# Patient Record
Sex: Female | Born: 1985 | Race: Asian | Hispanic: No | Marital: Married | Smoking: Former smoker
Health system: Southern US, Community
[De-identification: ages and names within clinical notes are randomized; demographics above are authoritative.]

## PROBLEM LIST (undated history)

## (undated) HISTORY — PX: APPENDECTOMY: SHX54

---

## 2015-09-12 ENCOUNTER — Emergency Department
Admission: EM | Admit: 2015-09-12 | Discharge: 2015-09-12 | Disposition: A | Discharge status: Home / Self Care | Payer: BLUE CROSS/BLUE SHIELD | Attending: Emergency Medicine | Admitting: Emergency Medicine

## 2015-09-12 ENCOUNTER — Encounter: Payer: Self-pay | Admitting: Emergency Medicine

## 2015-09-12 ENCOUNTER — Emergency Department: Payer: BLUE CROSS/BLUE SHIELD

## 2015-09-12 DIAGNOSIS — O209 Hemorrhage in early pregnancy, unspecified: Secondary | ICD-10-CM | POA: Insufficient documentation

## 2015-09-12 DIAGNOSIS — Z87891 Personal history of nicotine dependence: Secondary | ICD-10-CM | POA: Diagnosis not present

## 2015-09-12 DIAGNOSIS — Z3A01 Less than 8 weeks gestation of pregnancy: Secondary | ICD-10-CM | POA: Insufficient documentation

## 2015-09-12 LAB — COMPREHENSIVE METABOLIC PANEL
ALT: 20 U/L (ref 14–54)
ANION GAP: 5 (ref 5–15)
AST: 24 U/L (ref 15–41)
Albumin: 4.3 g/dL (ref 3.5–5.0)
Alkaline Phosphatase: 48 U/L (ref 38–126)
BUN: 8 mg/dL (ref 6–20)
CHLORIDE: 109 mmol/L (ref 101–111)
CO2: 24 mmol/L (ref 22–32)
CREATININE: 0.57 mg/dL (ref 0.44–1.00)
Calcium: 9.3 mg/dL (ref 8.9–10.3)
Glucose, Bld: 122 mg/dL — ABNORMAL HIGH (ref 65–99)
Potassium: 3.8 mmol/L (ref 3.5–5.1)
SODIUM: 138 mmol/L (ref 135–145)
Total Bilirubin: 0.6 mg/dL (ref 0.3–1.2)
Total Protein: 7.1 g/dL (ref 6.5–8.1)

## 2015-09-12 LAB — URINALYSIS COMPLETE WITH MICROSCOPIC (ARMC ONLY)
BILIRUBIN URINE: NEGATIVE
Glucose, UA: NEGATIVE mg/dL
KETONES UR: NEGATIVE mg/dL
Leukocytes, UA: NEGATIVE
NITRITE: NEGATIVE
PH: 7 (ref 5.0–8.0)
Protein, ur: NEGATIVE mg/dL
SPECIFIC GRAVITY, URINE: 1.004 — AB (ref 1.005–1.030)

## 2015-09-12 LAB — CBC WITH DIFFERENTIAL/PLATELET
Basophils Absolute: 0.1 10*3/uL (ref 0–0.1)
Basophils Relative: 1 %
EOS ABS: 0 10*3/uL (ref 0–0.7)
EOS PCT: 1 %
HCT: 36.8 % (ref 35.0–47.0)
Hemoglobin: 12.2 g/dL (ref 12.0–16.0)
LYMPHS ABS: 1.3 10*3/uL (ref 1.0–3.6)
LYMPHS PCT: 18 %
MCH: 29.8 pg (ref 26.0–34.0)
MCHC: 33.3 g/dL (ref 32.0–36.0)
MCV: 89.7 fL (ref 80.0–100.0)
MONO ABS: 0.4 10*3/uL (ref 0.2–0.9)
MONOS PCT: 5 %
Neutro Abs: 5.6 10*3/uL (ref 1.4–6.5)
Neutrophils Relative %: 75 %
PLATELETS: 225 10*3/uL (ref 150–440)
RBC: 4.1 MIL/uL (ref 3.80–5.20)
RDW: 12.3 % (ref 11.5–14.5)
WBC: 7.4 10*3/uL (ref 3.6–11.0)

## 2015-09-12 LAB — ABO/RH: ABO/RH(D): A POS

## 2015-09-12 LAB — HCG, QUANTITATIVE, PREGNANCY: HCG, BETA CHAIN, QUANT, S: 785 m[IU]/mL — AB (ref <5)

## 2015-09-12 NOTE — ED Notes (Signed)
Has not had ultrasound yet for this pregnancy, states is 5 week 6 day pregnant, first pregnancy

## 2015-09-12 NOTE — ED Notes (Signed)
Patient states that she is about [redacted] weeks pregnant, yesterday she started having spotting. Today flow is like a period. Patient also c/o lower back pain and mild abdominal cramping that feels like when she is on her period.

## 2015-09-12 NOTE — Discharge Instructions (Signed)
Please follow-up with the lab Monday morning here at the hospital to have a another "hCG" drawn. [facility] will follow-up with you, please call the listed number to set up an appointment for early this week [facility]. It is extremely important that you get follow-up this week.  Return to the emergency room right away if you develop a fever, severe pain, heavy bleeding, vomiting or other new concerns arise.  Vaginal Bleeding During Pregnancy, First Trimester A small amount of bleeding (spotting) from the vagina is relatively common in early pregnancy. It usually stops on its own. Various things may cause bleeding or spotting in early pregnancy. Some bleeding may be related to the pregnancy, and some may not. In most cases, the bleeding is normal and is not a problem. However, bleeding can also be a sign of something serious. Be sure to tell your health care provider about any vaginal bleeding right away. Some possible causes of vaginal bleeding during the first trimester include:  Infection or inflammation of the cervix.  Growths (polyps) on the cervix.  Miscarriage or threatened miscarriage.  Pregnancy tissue has developed outside of the uterus and in a fallopian tube (tubal pregnancy).  Tiny cysts have developed in the uterus instead of pregnancy tissue (molar pregnancy). HOME CARE INSTRUCTIONS  Watch your condition for any changes. The following actions may help to lessen any discomfort you are feeling:  Follow your health care provider's instructions for limiting your activity. If your health care provider orders bed rest, you may need to stay in bed and only get up to use the bathroom. However, your health care provider may allow you to continue light activity.  If needed, make plans for someone to help with your regular activities and responsibilities while you are on bed rest.  Keep track of the number of pads you use each day, how often you  change pads, and how soaked (saturated) they are. Write this down.  Do not use tampons. Do not douche.  Do not have sexual intercourse or orgasms until approved by your health care provider.  If you pass any tissue from your vagina, save the tissue so you can show it to your health care provider.  Only take over-the-counter or prescription medicines as directed by your health care provider.  Do not take aspirin because it can make you bleed.  Keep all follow-up appointments as directed by your health care provider. SEEK MEDICAL CARE IF:  You have any vaginal bleeding during any part of your pregnancy.  You have cramps or labor pains.  You have a fever, not controlled by medicine. SEEK IMMEDIATE MEDICAL CARE IF:   You have severe cramps in your back or belly (abdomen).  You pass large clots or tissue from your vagina.  Your bleeding increases.  You feel light-headed or weak, or you have fainting episodes.  You have chills.  You are leaking fluid or have a gush of fluid from your vagina.  You pass out while having a bowel movement. MAKE SURE YOU:  Understand these instructions.  Will watch your condition.  Will get help right away if you are not doing well or get worse.   This information is not intended to replace advice given to you by your health care provider. Make sure you discuss any questions you have with your health care provider.   Document Released: 07/27/2005 Document Revised: 10/22/2013 Document Reviewed: 06/24/2013 Elsevier Interactive Patient Education [employer]2016 Elsevier Inc.

## 2015-09-12 NOTE — ED Provider Notes (Signed)
[facility] Emergency Department Provider Note REMINDER - THIS NOTE IS NOT A FINAL MEDICAL RECORD UNTIL IT IS SIGNED. UNTIL THEN, THE CONTENT BELOW MAY REFLECT INFORMATION FROM A DOCUMENTATION TEMPLATE, NOT THE ACTUAL PATIENT VISIT. ____________________________________________  Time seen: Approximately 10:24 AM  I have reviewed the triage vital signs and the nursing notes.   HISTORY  Chief Complaint Vaginal Bleeding    HPI Janet Pena is a 29 y.o. female denies previous medical history.  Patient is probably [redacted] weeks pregnant based on last menstrual period. She reports that throughout her pregnancy she's had occasional spotting, but yesterday she started developing spotting similar to that. Then this morning she passed a small clot which she is concerning could represent baby. She reports a slight achy pain and cramping in the lower uterus, denies any fevers chills nausea or vomiting. She says at present she is comfortable and not in any pain. She does not take any birth control, no fertility treatments.  The present time she feels sad and states that she is concerned she may have had a miscarriage and doesn't know why. She denies any thoughts point armor self or anyone else, but just states that she is upset about having a miscarriage and is concerned there could be something wrong. She has not yet seen the care of an OB though has an appointment but not until 10 weeks of pregnancy.  She does take prenatal vitamin but no other medications.   History reviewed. No pertinent past medical history.  There are no active problems to display for this patient.   Past Surgical History  Procedure Laterality Date  . Appendectomy      No current outpatient prescriptions on file.  Allergies Review of patient's allergies indicates not on file.  No family history on file.  Social History Social History  Substance Use Topics  . Smoking status: Former Games developermoker  .  Smokeless tobacco: None  . Alcohol Use: No    Review of Systems Constitutional: No fever/chills Eyes: No visual changes. ENT: No sore throat. Cardiovascular: Denies chest pain. Respiratory: Denies shortness of breath. Gastrointestinal: Occasional cramping in the pelvis..  No nausea, no vomiting.  No diarrhea.  No constipation. Genitourinary: Negative for dysuria. Musculoskeletal: Negative for back pain. Skin: Negative for rash. Neurological: Negative for headaches, focal weakness or numbness.  10-point ROS otherwise negative.  ____________________________________________   PHYSICAL EXAM:  VITAL SIGNS: ED Triage Vitals  Enc Vitals Group     BP 09/12/15 0916 108/67 mmHg     Pulse Rate 09/12/15 0916 81     Resp 09/12/15 0916 16     Temp 09/12/15 0916 98.3 F (36.8 C)     Temp Source 09/12/15 0916 Oral     SpO2 09/12/15 0916 98 %     Weight --      Height --      Head Cir --      Peak Flow --      Pain Score --      Pain Loc --      Pain Edu? --      Excl. in GC? --    Constitutional: Alert and oriented. Well appearing and in no acute distress. Eyes: Conjunctivae are normal. PERRL. EOMI. Head: Atraumatic. Nose: No congestion/rhinnorhea. Mouth/Throat: Mucous membranes are moist.  Oropharynx non-erythematous. Neck: No stridor.   Cardiovascular: Normal rate, regular rhythm. Grossly normal heart sounds.  Good peripheral circulation. Respiratory: Normal respiratory effort.  No retractions. Lungs CTAB. Gastrointestinal: Soft  and nontender except for some very slight discomfort with palpation suprapubically. No distention. No abdominal bruits. No CVA tenderness. Musculoskeletal: No lower extremity tenderness nor edema.  No joint effusions. Neurologic:  Normal speech and language. No gross focal neurologic deficits are appreciated. Skin:  Skin is warm, dry and intact. No rash noted. Psychiatric: Mood and affect are normal. Speech and behavior are  normal.  ____________________________________________   LABS (all labs ordered are listed, but only abnormal results are displayed)  Labs Reviewed  COMPREHENSIVE METABOLIC PANEL - Abnormal; Notable for the following:    Glucose, Bld 122 (*)    All other components within normal limits  HCG, QUANTITATIVE, PREGNANCY - Abnormal; Notable for the following:    hCG, Beta Chain, Quant, S 785 (*)    All other components within normal limits  URINALYSIS COMPLETEWITH MICROSCOPIC ([facility] ONLY) - Abnormal; Notable for the following:    Color, Urine STRAW (*)    APPearance CLEAR (*)    Specific Gravity, Urine 1.004 (*)    Hgb urine dipstick 1+ (*)    Bacteria, UA RARE (*)    Squamous Epithelial / LPF 0-5 (*)    All other components within normal limits  CBC WITH DIFFERENTIAL/PLATELET  ABO/RH   ____________________________________________  EKG   ____________________________________________  RADIOLOGY  No IUP is visualized.  13 mm complex cystic lesion in the lower uterine segment/cervix, not typical but possibly reflecting a complex nabothian cyst. In the correct clinical setting, this also could reflect abortion in progress.  By definition, in the setting of a positive pregnancy test, this reflects a pregnancy of unknown location. Differential considerations include early IUP, abnormal IUP/missed abortion, or nonvisualized ectopic pregnancy. Follow-up beta HCG is suggested, with repeat pelvic ultrasound in 5-7 days (or earlier as clinically warranted). ____________________________________________   PROCEDURES  Procedure(s) performed: None  Critical Care performed: No  ____________________________________________   INITIAL IMPRESSION / ASSESSMENT AND PLAN / ED COURSE  Pertinent labs & imaging results that were available during my care of the patient were reviewed by me and considered in my medical decision making (see chart for details).  Patient partially [redacted] weeks  pregnant now present with vaginal spotting, cramps and the patient was noted to pass a fairly unusual appearing clot that looks like possible fetal tissue or fetal pole in the ER. This is been sent for pathology examination, though I most suspect miscarriage we will continue to rule out other causes such as ectopic. She is in no distress with normal vital signs. We'll obtain ultrasound, Rh, and hCG and continue to follow her clinically.  RH STATUS +   ----------------------------------------- 1:11 PM on 09/12/2015 -----------------------------------------  Reviewed ultrasound and plan with the patient. There is no evidence of ectopic, and I suspect she is likely miscarried though we cannot exclude the possibility of early or ongoing pregnancy. She will follow-up with the lab on Monday, I have provided her a prescription for hCG level and I discussed with Dr. Leeroy Bock Ward of our obstetrics service who will follow her up early this week in the clinic. The patient is very agreeable. Plan, clinical history, an ultrasound reviewed with Dr. Elesa Massed.  Careful return precautions advised. ____________________________________________   FINAL CLINICAL IMPRESSION(S) / ED DIAGNOSES  Final diagnoses:  First trimester bleeding      Sharyn Creamer, MD 09/12/15 1312

## 2015-09-14 ENCOUNTER — Encounter: Payer: Self-pay | Admitting: Emergency Medicine

## 2015-09-14 ENCOUNTER — Other Ambulatory Visit
Admission: RE | Admit: 2015-09-14 | Discharge: 2015-09-14 | Disposition: A | Discharge status: Home / Self Care | Payer: BLUE CROSS/BLUE SHIELD | Source: Ambulatory Visit | Attending: Emergency Medicine | Admitting: Emergency Medicine

## 2015-09-14 ENCOUNTER — Emergency Department
Admission: EM | Admit: 2015-09-14 | Discharge: 2015-09-14 | Disposition: A | Discharge status: Home / Self Care | Payer: BLUE CROSS/BLUE SHIELD | Attending: Emergency Medicine | Admitting: Emergency Medicine

## 2015-09-14 DIAGNOSIS — Z3A01 Less than 8 weeks gestation of pregnancy: Secondary | ICD-10-CM | POA: Diagnosis not present

## 2015-09-14 DIAGNOSIS — Z87891 Personal history of nicotine dependence: Secondary | ICD-10-CM | POA: Insufficient documentation

## 2015-09-14 DIAGNOSIS — O200 Threatened abortion: Secondary | ICD-10-CM | POA: Diagnosis not present

## 2015-09-14 DIAGNOSIS — O9989 Other specified diseases and conditions complicating pregnancy, childbirth and the puerperium: Secondary | ICD-10-CM | POA: Diagnosis present

## 2015-09-14 DIAGNOSIS — O039 Complete or unspecified spontaneous abortion without complication: Secondary | ICD-10-CM

## 2015-09-14 LAB — URINALYSIS COMPLETE WITH MICROSCOPIC (ARMC ONLY)
BACTERIA UA: NONE SEEN
Bilirubin Urine: NEGATIVE
GLUCOSE, UA: NEGATIVE mg/dL
Ketones, ur: NEGATIVE mg/dL
LEUKOCYTES UA: NEGATIVE
NITRITE: NEGATIVE
Protein, ur: NEGATIVE mg/dL
SPECIFIC GRAVITY, URINE: 1.01 (ref 1.005–1.030)
pH: 5 (ref 5.0–8.0)

## 2015-09-14 LAB — COMPREHENSIVE METABOLIC PANEL
ALBUMIN: 4.3 g/dL (ref 3.5–5.0)
ALT: 20 U/L (ref 14–54)
AST: 17 U/L (ref 15–41)
Alkaline Phosphatase: 48 U/L (ref 38–126)
Anion gap: 7 (ref 5–15)
BUN: 11 mg/dL (ref 6–20)
CHLORIDE: 106 mmol/L (ref 101–111)
CO2: 22 mmol/L (ref 22–32)
CREATININE: 0.41 mg/dL — AB (ref 0.44–1.00)
Calcium: 9.1 mg/dL (ref 8.9–10.3)
GFR calc Af Amer: >60 mL/min (ref >60)
GLUCOSE: 105 mg/dL — AB (ref 65–99)
POTASSIUM: 3.9 mmol/L (ref 3.5–5.1)
Sodium: 135 mmol/L (ref 135–145)
Total Bilirubin: 0.4 mg/dL (ref 0.3–1.2)
Total Protein: 7.2 g/dL (ref 6.5–8.1)

## 2015-09-14 LAB — POCT PREGNANCY, URINE: PREG TEST UR: POSITIVE — AB

## 2015-09-14 LAB — CBC
HEMATOCRIT: 36.7 % (ref 35.0–47.0)
Hemoglobin: 12 g/dL (ref 12.0–16.0)
MCH: 29.5 pg (ref 26.0–34.0)
MCHC: 32.7 g/dL (ref 32.0–36.0)
MCV: 90.1 fL (ref 80.0–100.0)
PLATELETS: 224 10*3/uL (ref 150–440)
RBC: 4.07 MIL/uL (ref 3.80–5.20)
RDW: 12.4 % (ref 11.5–14.5)
WBC: 6.8 10*3/uL (ref 3.6–11.0)

## 2015-09-14 LAB — HCG, QUANTITATIVE, PREGNANCY: HCG, BETA CHAIN, QUANT, S: 157 m[IU]/mL — AB (ref <5)

## 2015-09-14 LAB — LIPASE, BLOOD: LIPASE: 26 U/L (ref 11–51)

## 2015-09-14 NOTE — ED Notes (Signed)
Pt to ed with c/o lower abd pain since this am.  Pt was seen here for possible miscarriage on 09/12/15. Pt also reports nausea.

## 2015-09-14 NOTE — ED Provider Notes (Signed)
[facility] Emergency Department Provider Note  ____________________________________________  Time seen: 11 PM  I have reviewed the triage vital signs and the nursing notes.   HISTORY  Chief Complaint Abdominal Pain    HPI Janet Pena is a 29 y.o. female who presents with mild to moderate lower abdominal and pelvic cramping pain for the last day. She reports she was seen this weekend and had labs and an ultrasound done and was told that she may be having a miscarriage. She reports she is proximal to [redacted] weeks pregnant. She is not having bleeding today but does continue to have cramping sensation. She had follow-up hormone level this morning but does not know the result.     History reviewed. No pertinent past medical history.  There are no active problems to display for this patient.   Past Surgical History  Procedure Laterality Date  . Appendectomy      No current outpatient prescriptions on file.  Allergies Review of patient's allergies indicates no known allergies.  History reviewed. No pertinent family history.  Social History Social History  Substance Use Topics  . Smoking status: Former Games developermoker  . Smokeless tobacco: None  . Alcohol Use: No    Review of Systems  Constitutional: Negative for fever. Eyes: Negative for visual changes. ENT: Negative for sore throat Cardiovascular: Negative for chest pain. Respiratory: Negative for shortness of breath. Gastrointestinal: As above Genitourinary: Negative for dysuria. No Vaginal bleeding Musculoskeletal: Negative for back pain. Skin: Negative for rash. Neurological: Negative for headaches or focal weakness Psychiatric: No anxiety    ____________________________________________   PHYSICAL EXAM:  VITAL SIGNS: ED Triage Vitals  Enc Vitals Group     BP 09/14/15 1104 105/58 mmHg     Pulse Rate 09/14/15 1104 76     Resp 09/14/15 1104 20     Temp 09/14/15 1104 98.1 F (36.7 C)   Temp Source 09/14/15 1104 Oral     SpO2 09/14/15 1104 100 %     Weight 09/14/15 1104 140 lb (63.504 kg)     Height 09/14/15 1104 5' 4.96" (1.65 m)     Head Cir --      Peak Flow --      Pain Score 09/14/15 1105 8     Pain Loc --      Pain Edu? --      Excl. in GC? --      Constitutional: Alert and oriented. Well appearing and in no distress. Eyes: Conjunctivae are normal.  ENT   Head: Normocephalic and atraumatic.   Mouth/Throat: Mucous membranes are moist. Cardiovascular: Normal rate, regular rhythm. Respiratory: Normal respiratory effort without tachypnea nor retractions.  Gastrointestinal: Soft and non-tender in all quadrants. No distention. There is no CVA tenderness. Genitourinary: deferred Musculoskeletal: Nontender with normal range of motion in all extremities. No lower extremity tenderness nor edema. Neurologic:  Normal speech and language. No gross focal neurologic deficits are appreciated. Skin:  Skin is warm, dry and intact. No rash noted. Psychiatric: Mood and affect are normal. Patient exhibits appropriate insight and judgment.  ____________________________________________    LABS (pertinent positives/negatives)  Labs Reviewed  COMPREHENSIVE METABOLIC PANEL - Abnormal; Notable for the following:    Glucose, Bld 105 (*)    Creatinine, Ser 0.41 (*)    All other components within normal limits  URINALYSIS COMPLETEWITH MICROSCOPIC ([facility] ONLY) - Abnormal; Notable for the following:    Color, Urine STRAW (*)    APPearance CLEAR (*)    Hgb  urine dipstick 3+ (*)    Squamous Epithelial / LPF 0-5 (*)    All other components within normal limits  POCT PREGNANCY, URINE - Abnormal; Notable for the following:    Preg Test, Ur POSITIVE (*)    All other components within normal limits  LIPASE, BLOOD  CBC  POC URINE PREG, ED    ____________________________________________   EKG None  ____________________________________________    RADIOLOGY I have  personally reviewed any xrays that were ordered on this patient: Reviewed ultrasound from the weekend  ____________________________________________   PROCEDURES  Procedure(s) performed: none  Critical Care performed: none  ____________________________________________   INITIAL IMPRESSION / ASSESSMENT AND PLAN / ED COURSE  Pertinent labs & imaging results that were available during my care of the patient were reviewed by me and considered in my medical decision making (see chart for details).  Patient with decreasing beta hCG consistent with miscarriage. Discussed with Dr. Vergie Living of OB/GYN who recommends outpatient follow-up later this week with follow-up ultrasound. Discussed this with the patient and she agrees to follow up with her gynecologist, she knows she can return if any worsening of her symptoms  ____________________________________________   FINAL CLINICAL IMPRESSION(S) / ED DIAGNOSES  Final diagnoses:  Miscarriage     Jene Every, MD 09/14/15 1454

## 2015-09-14 NOTE — Discharge Instructions (Signed)
Miscarriage  A miscarriage is the sudden loss of an unborn baby (fetus) before the 20th week of pregnancy. Most miscarriages happen in the first 3 months of pregnancy. Sometimes, it happens before a woman even knows she is pregnant. A miscarriage is also called a "spontaneous miscarriage" or "early pregnancy loss." Having a miscarriage can be an emotional experience. Talk with your caregiver about any questions you may have about miscarrying, the grieving process, and your future pregnancy plans.  CAUSES    Problems with the fetal chromosomes that make it impossible for the baby to develop normally. Problems with the baby's genes or chromosomes are most often the result of errors that occur, by chance, as the embryo divides and grows. The problems are not inherited from the parents.   Infection of the cervix or uterus.    Hormone problems.    Problems with the cervix, such as having an incompetent cervix. This is when the tissue in the cervix is not strong enough to hold the pregnancy.    Problems with the uterus, such as an abnormally shaped uterus, uterine fibroids, or congenital abnormalities.    Certain medical conditions.    Smoking, drinking alcohol, or taking illegal drugs.    Trauma.   Often, the cause of a miscarriage is unknown.   SYMPTOMS    Vaginal bleeding or spotting, with or without cramps or pain.   Pain or cramping in the abdomen or lower back.   Passing fluid, tissue, or blood clots from the vagina.  DIAGNOSIS   Your caregiver will perform a physical exam. You may also have an ultrasound to confirm the miscarriage. Blood or urine tests may also be ordered.  TREATMENT    Sometimes, treatment is not necessary if you naturally pass all the fetal tissue that was in the uterus. If some of the fetus or placenta remains in the body (incomplete miscarriage), tissue left behind may become infected and must be removed. Usually, a dilation and curettage (D and C) procedure is performed.  During a D and C procedure, the cervix is widened (dilated) and any remaining fetal or placental tissue is gently removed from the uterus.   Antibiotic medicines are prescribed if there is an infection. Other medicines may be given to reduce the size of the uterus (contract) if there is a lot of bleeding.   If you have Rh negative blood and your baby was Rh positive, you will need a Rh immunoglobulin shot. This shot will protect any future baby from having Rh blood problems in future pregnancies.  HOME CARE INSTRUCTIONS    Your caregiver may order bed rest or may allow you to continue light activity. Resume activity as directed by your caregiver.   Have someone help with home and family responsibilities during this time.    Keep track of the number of sanitary pads you use each day and how soaked (saturated) they are. Write down this information.    Do not use tampons. Do not douche or have sexual intercourse until approved by your caregiver.    Only take over-the-counter or prescription medicines for pain or discomfort as directed by your caregiver.    Do not take aspirin. Aspirin can cause bleeding.    Keep all follow-up appointments with your caregiver.    If you or your partner have problems with grieving, talk to your caregiver or seek counseling to help cope with the pregnancy loss. Allow enough time to grieve before trying to get pregnant again.     SEEK IMMEDIATE MEDICAL CARE IF:    You have severe cramps or pain in your back or abdomen.   You have a fever.   You pass large blood clots (walnut-sized or larger) ortissue from your vagina. Save any tissue for your caregiver to inspect.    Your bleeding increases.    You have a thick, bad-smelling vaginal discharge.   You become lightheaded, weak, or you faint.    You have chills.   MAKE SURE YOU:   Understand these instructions.   Will watch your condition.   Will get help right away if you are not doing well or get worse.     This  information is not intended to replace advice given to you by your health care provider. Make sure you discuss any questions you have with your health care provider.     Document Released: 04/12/2001 Document Revised: 02/11/2013 Document Reviewed: 12/06/2011  Elsevier Interactive Patient Education 2016 Else[employer] Inc[employer]

## 2015-09-15 LAB — SURGICAL PATHOLOGY

## 2016-02-01 IMAGING — US US OB COMP LESS 14 WK
1 series · 13 of 28 positions shown · non-contrast
Comparison: None.

CLINICAL DATA: Pregnant, beta HCG 785, vaginal bleeding

EXAM:
OBSTETRIC <14 WK US AND TRANSVAGINAL OB US
TECHNIQUE: Both transabdominal and transvaginal ultrasound examinations were
performed for complete evaluation of the gestation as well as the
maternal uterus, adnexal regions, and pelvic cul-de-sac.
Transvaginal technique was performed to assess early pregnancy.

[Series 1: us ob comp less 14 wk · 0.25mm/px · 13 of 73 slices shown]
[im 3/73]
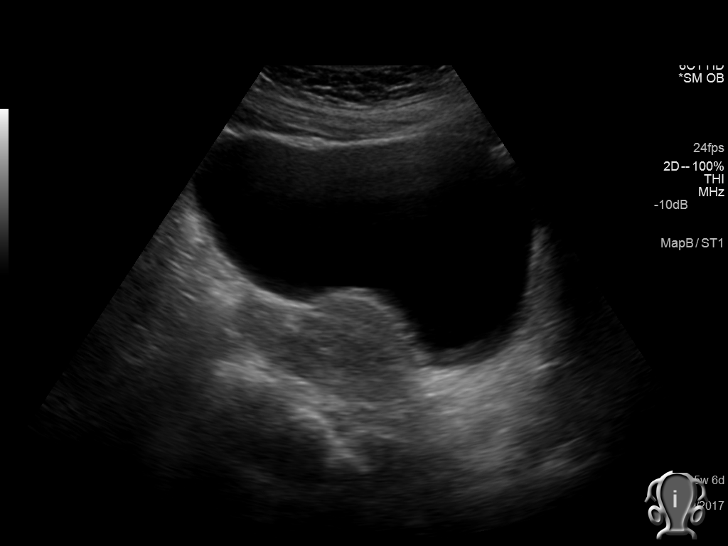
[im 9/73]
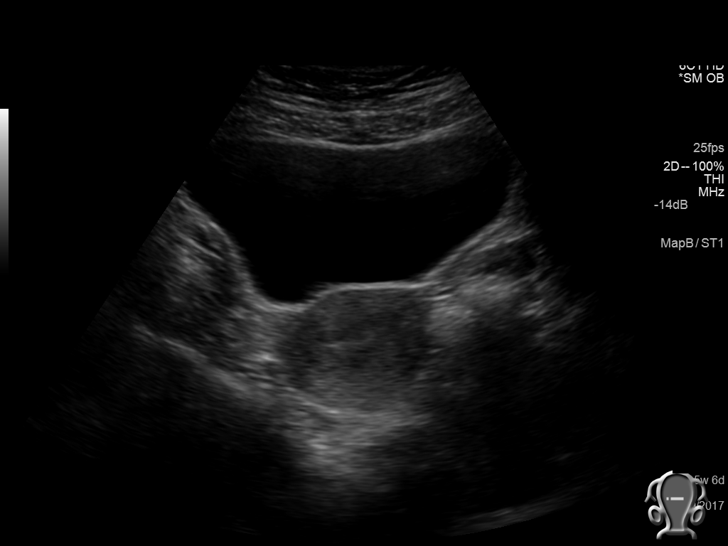
[im 14/73]
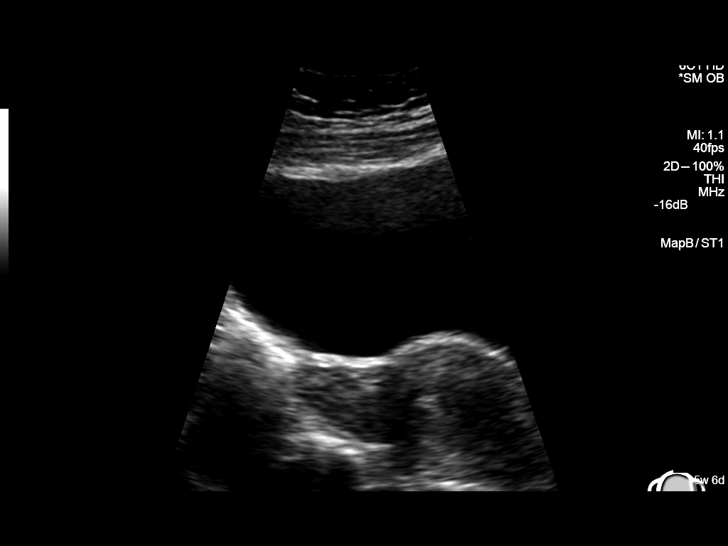
[im 19/73]
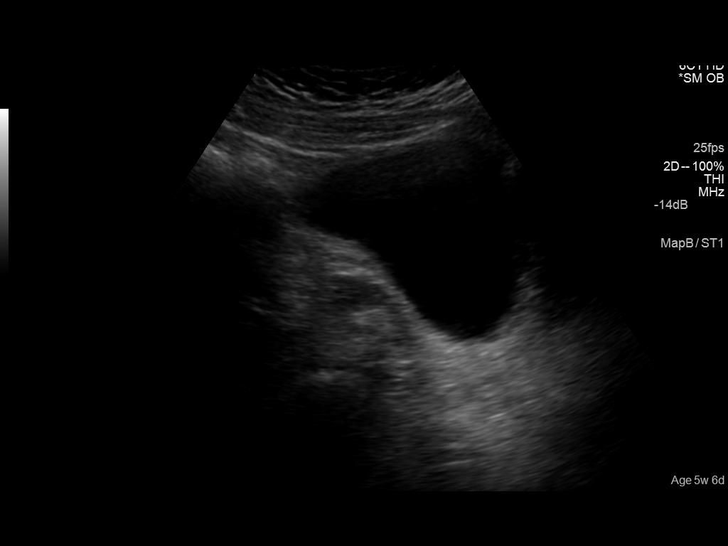
[im 25/73]
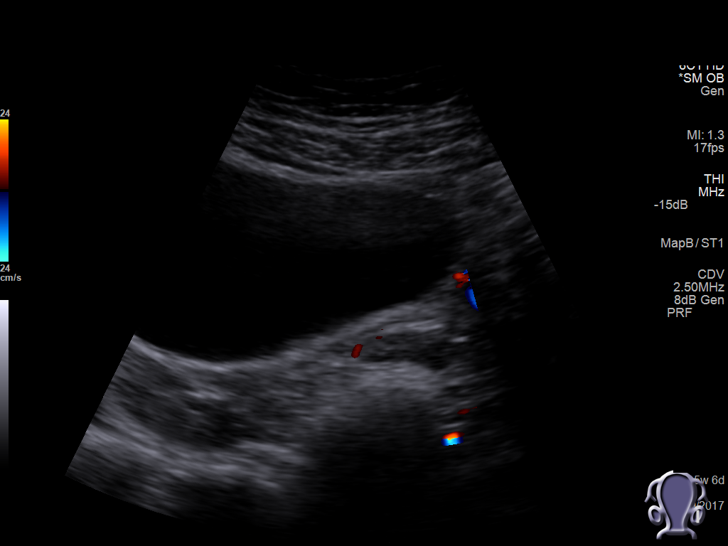
[im 30/73]
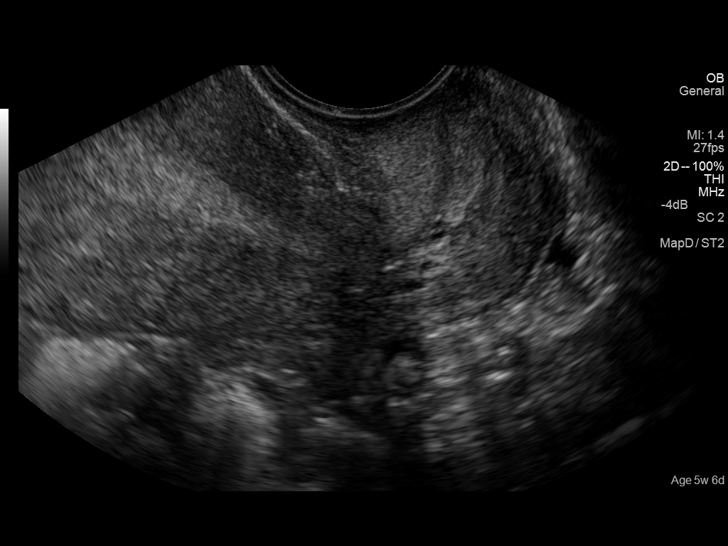
[im 38/73]
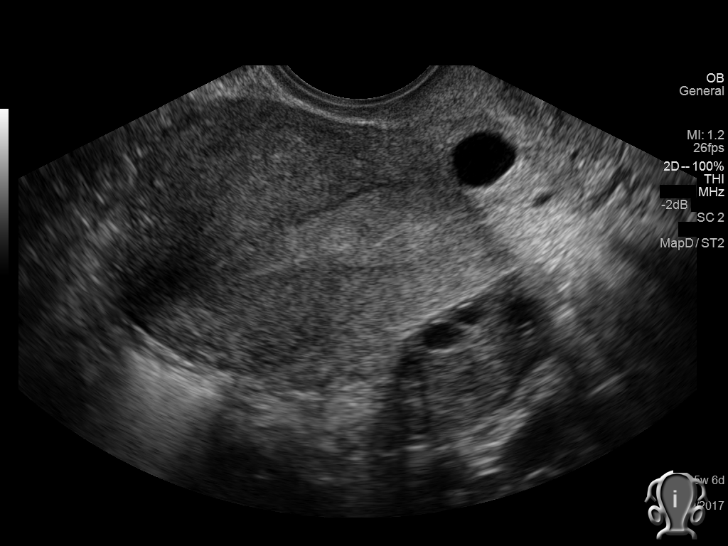
[im 43/73]
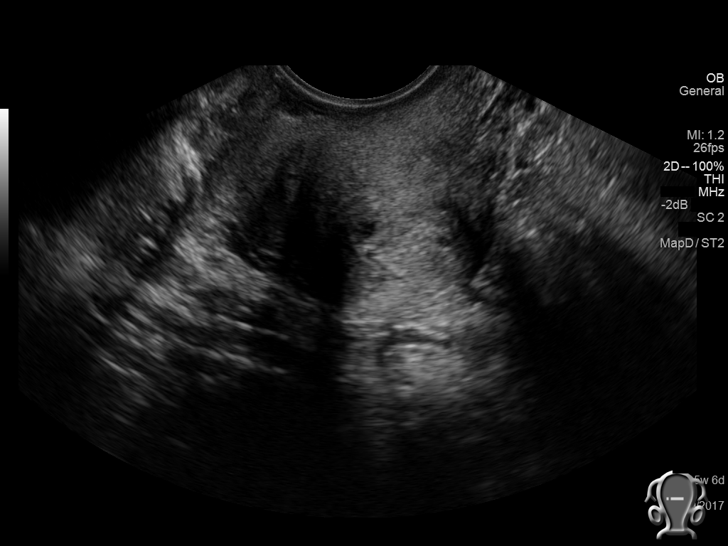
[im 49/73]
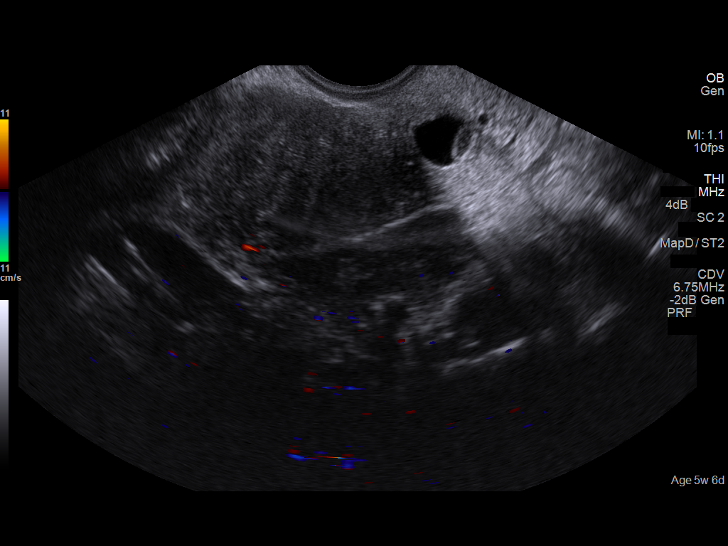
[im 54/73]
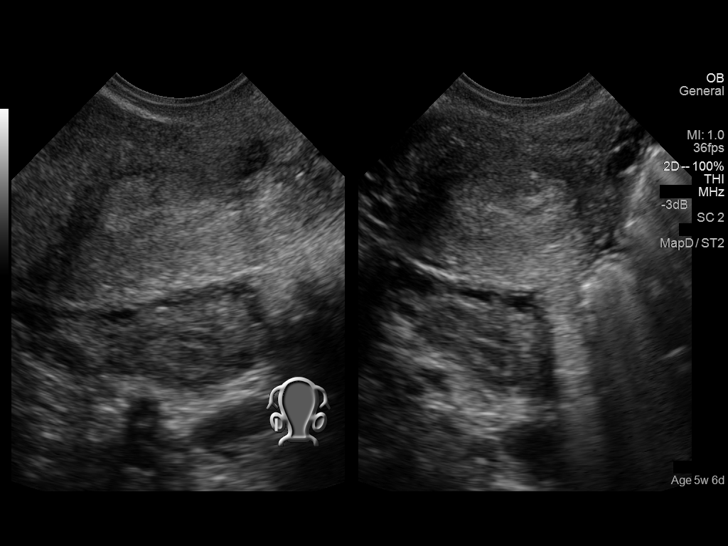
[im 59/73]
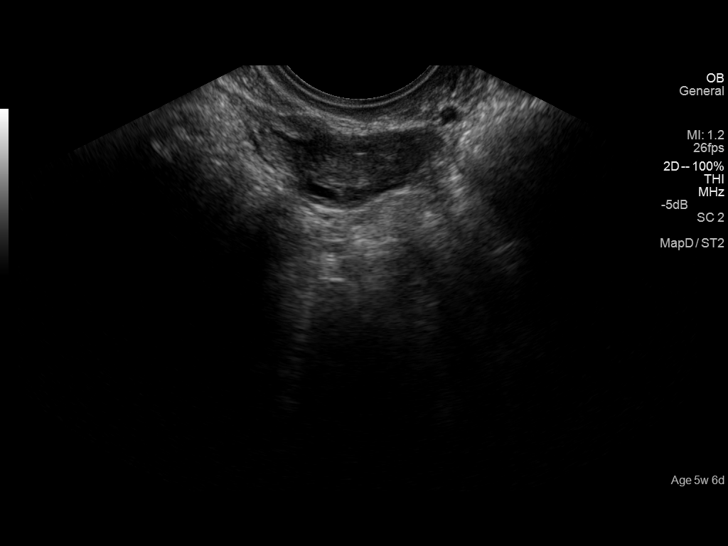
[im 65/73]
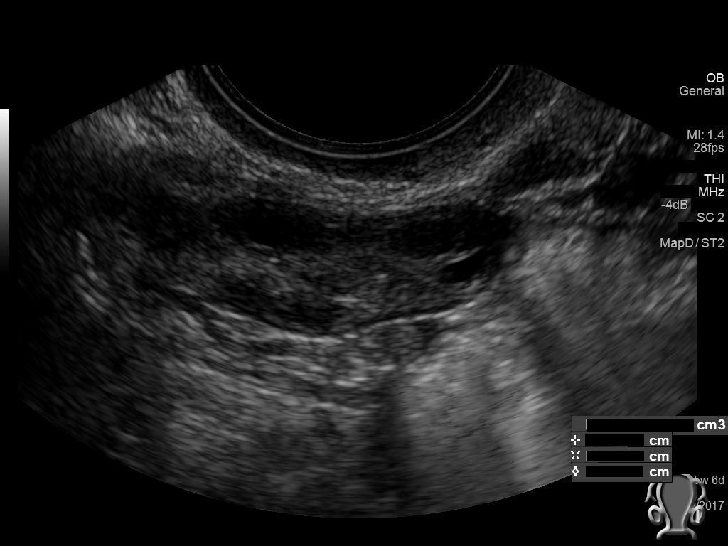
[im 70/73]
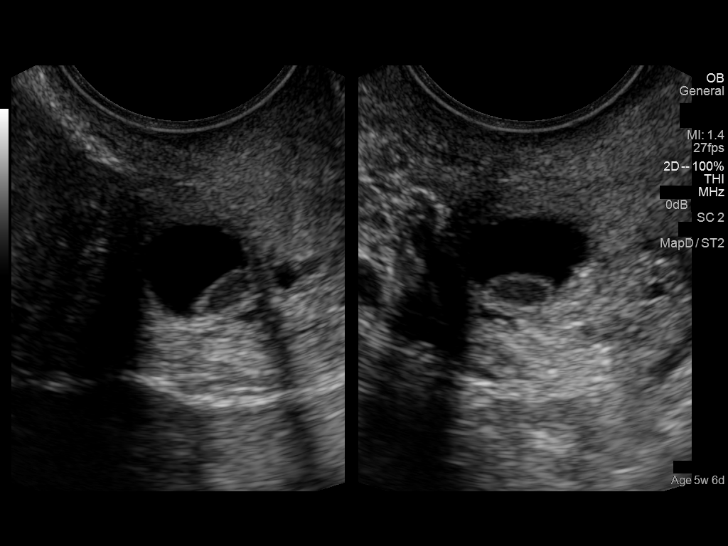

[13 of 28 positions shown; findings below may reference images not displayed]

FINDINGS: Intrauterine gestational sac: Not visualized

Maternal uterus/adnexae: Endometrial complex measures 13 mm in the
lower uterine body.

10 x 11 x 13 mm complex cystic lesion in the lower uterine
segment/cervix. This appearance is not typical but may reflect a
complex nabothian cyst. In the correct clinical setting, this also
could reflect an abortion in progress.

Right ovary is within normal limits, noting a 12 x 8 x 8 mm corpus
luteal cyst.

Left ovary is within normal limits.

No free fluid.
IMPRESSION: No IUP is visualized.

13 mm complex cystic lesion in the lower uterine segment/cervix, not
typical but possibly reflecting a complex nabothian cyst. In the
correct clinical setting, this also could reflect abortion in
progress.

By definition, in the setting of a positive pregnancy test, this
reflects a pregnancy of unknown location. Differential
considerations include early IUP, abnormal IUP/missed abortion, or
nonvisualized ectopic pregnancy. Follow-up beta HCG is suggested,
with repeat pelvic ultrasound in 5-7 days (or earlier as clinically
warranted).
